# Patient Record
Sex: Male | Born: 1995 | Race: Black or African American | Hispanic: No | Marital: Single | Smoking: Never smoker
Health system: Southern US, Community
[De-identification: ages and names within clinical notes are randomized; demographics above are authoritative.]

## PROBLEM LIST (undated history)

## (undated) DIAGNOSIS — R21 Rash and other nonspecific skin eruption: Secondary | ICD-10-CM

## (undated) DIAGNOSIS — R1084 Generalized abdominal pain: Secondary | ICD-10-CM

## (undated) DIAGNOSIS — Z113 Encounter for screening for infections with a predominantly sexual mode of transmission: Secondary | ICD-10-CM

---
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
## 2017-05-29 ENCOUNTER — Encounter (HOSPITAL_COMMUNITY): Payer: Self-pay | Admitting: Emergency Medicine

## 2017-05-29 ENCOUNTER — Ambulatory Visit (HOSPITAL_COMMUNITY)
Admission: EM | Admit: 2017-05-29 | Discharge: 2017-05-29 | Disposition: A | Discharge status: Home / Self Care | Payer: Self-pay | Attending: Emergency Medicine | Admitting: Emergency Medicine

## 2017-05-29 DIAGNOSIS — T7840XA Allergy, unspecified, initial encounter: Secondary | ICD-10-CM

## 2017-05-29 MED ORDER — PREDNISONE 10 MG (21) PO TBPK: ORAL_TABLET | ORAL | 0 refills | Status: DC

## 2017-05-29 NOTE — ED Provider Notes (Signed)
  [facility]   [id number] 05/29/17 Arrival Time: 1344   SUBJECTIVE:  Aurelio Brashaekeem Hodapp is a 21 y.o. male who presents to the urgent care with complaint of swelling and itching on his penis. He is currently being treated for a fungal infection and has had improvement in symptoms until last night. He reports he had intercourse and wore a condom and shortly after that the skin became red and swollen. He is concerned about the possibility of latex allergy. He has no shortness of breath, wheezing, or difficulty swelling. Symptoms are limited to the penis. He has no dysuria, frequency, or urgency, no flank pain, or other symptoms.     History reviewed. No pertinent past medical history. Social History   Social History  . Marital status: Single    Spouse name: N/A  . Number of children: N/A  . Years of education: N/A   Occupational History  . Not on file.   Social History Main Topics  . Smoking status: Never Smoker  . Smokeless tobacco: Never Used  . Alcohol use Yes  . Drug use: No  . Sexual activity: Not on file   Other Topics Concern  . Not on file   Social History Narrative  . No narrative on file   No outpatient prescriptions have been marked as taking for the 05/29/17 encounter [facility](Hospital Encounter).   Allergies  Allergen Reactions  . Latex       ROS: As per HPI, remainder of ROS negative.   OBJECTIVE:  Vitals:   05/29/17 1528  BP: (!) 118/52  Pulse: 61  Resp: 20  Temp: 98.1 F (36.7 C)  TempSrc: Oral  SpO2: 100%       General Appearance:  awake, alert, oriented, in no acute distress, well developed, well nourished and in no acute distress Skin:  Area of erythema and crusting forming a ringlike pattern surrounding the penis. Head/face:  NCAT Ears:  External- bilateral-  normal Back:  no pain to palpation, no CVA tenderness Urogen: Circumcised male, no urethra discharge, no fascicular lesions, or-like lesions, no inguinal lymphadenopathy, no  pain with palpation of the testicles Psych exam:alert,oriented, in NAD with a full range of affect, normal behavior and no psychotic features     Labs:  No results found for this or any previous visit.  Labs Reviewed - No data to display  No results found.     ASSESSMENT & PLAN:  1. Allergic reaction, initial encounter     Meds ordered this encounter  Medications  . predniSONE (STERAPRED UNI-PAK 21 TAB) 10 MG (21) TBPK tablet    Sig: Take 6 tablets tomorrow, decrease by 1 each day till finished (4,0,9,8,1,1(6,5,4,3,2,1)    Dispense:  21 tablet    Refill:  0    Order Specific Question:   Supervising Provider    Answer:   Sharlene DoryWENDLING, NICHOLAS PAUL [[id number]]   Follow up with dermatology if symptoms persist  Reviewed expectations re: course of current medical issues. Questions answered. Outlined signs and symptoms indicating need for more acute intervention. Patient verbalized understanding. After Visit Summary given.    Procedures:        Dorena BodoKennard, Troy Kanouse, NP 05/29/17 1621

## 2017-05-29 NOTE — ED Triage Notes (Signed)
Pt here for possible allergic reaction to condom latex  Sts he had SI last night and penis was swollen, itching and painful  Denies SOB, dyspnea, dysphagia   A&O x4... NAD... Ambulatory

## 2017-05-29 NOTE — Discharge Instructions (Signed)
Continue the antifungal's as prescribed by your primary care doctor. To treat for an allergic reaction, and started you on a prednisone taper, take this as directed. I also take over-the-counter Benadryl 2 tablets every 6 hours, not to exceed 300 mg a day. Consider latex free products for future use, if symptoms continue, follow up with a dermatologist as needed.

## 2018-01-24 ENCOUNTER — Encounter (HOSPITAL_COMMUNITY): Payer: Self-pay

## 2018-01-24 ENCOUNTER — Emergency Department (HOSPITAL_COMMUNITY)
Admission: EM | Admit: 2018-01-24 | Discharge: 2018-01-24 | Disposition: A | Discharge status: Home / Self Care | Payer: 59 | Attending: Emergency Medicine | Admitting: Emergency Medicine

## 2018-01-24 DIAGNOSIS — Z91040 Latex allergy status: Secondary | ICD-10-CM | POA: Diagnosis not present

## 2018-01-24 DIAGNOSIS — N509 Disorder of male genital organs, unspecified: Secondary | ICD-10-CM | POA: Insufficient documentation

## 2018-01-24 DIAGNOSIS — Z113 Encounter for screening for infections with a predominantly sexual mode of transmission: Secondary | ICD-10-CM | POA: Insufficient documentation

## 2018-01-24 NOTE — ED Triage Notes (Signed)
Pt presents with 2 day h/o bumps on penis.  Pt denies any dysuria or penile drainage.

## 2018-01-24 NOTE — Discharge Instructions (Addendum)
Please read the instructions below.  Talk with your primary care provider about any new medications.  You will receive a call from the hospital if your test results come back positive. Avoid sexual activity until you know your test results. If your results come back positive, it is important that you inform all of your sexual partners.  Return to the ER for new or worsening symptoms.

## 2018-01-24 NOTE — ED Provider Notes (Signed)
[facility] EMERGENCY DEPARTMENT Provider Note   CSN: [id number] Arrival date & time: 01/24/18  2107     History   Chief Complaint No chief complaint on file.   HPI Tyler Holden is a 22 y.o. male with past medical history of chlamydia, presenting to the ED with concern of small bumps to his penis that he noticed 2 days ago.  Patient states the bumps are not painless or itchy.  Denies drainage from bumps.  States he is currently sexually active with male partners only, and uses protection every time.  Denies penile discharge, dysuria, testicular pain or swelling, abdominal pain, pain with defecation.  The history is provided by the patient.    History reviewed. No pertinent past medical history.  There are no active problems to display for this patient.   History reviewed. No pertinent surgical history.      Home Medications    Prior to Admission medications   Medication Sig Start Date End Date Taking? Authorizing Provider  predniSONE (STERAPRED UNI-PAK 21 TAB) 10 MG (21) TBPK tablet Take 6 tablets tomorrow, decrease by 1 each day till finished (6,5,4,3,2,1) 05/29/17   Dorena Bodo, NP    Family History History reviewed. No pertinent family history.  Social History Social History   Tobacco Use  . Smoking status: Never Smoker  . Smokeless tobacco: Never Used  Substance Use Topics  . Alcohol use: Yes  . Drug use: No     Allergies   Latex   Review of Systems Review of Systems  Constitutional: Negative for fever.  Gastrointestinal: Negative for abdominal pain.  Genitourinary: Negative for discharge, dysuria, penile pain, scrotal swelling and testicular pain.       "bumps" on penis     Physical Exam Updated Vital Signs BP 112/68 (BP Location: Right Arm)   Pulse 93   Temp 99.6 F (37.6 C) (Oral)   Resp 18   Ht  (1.854 m)   Wt 72.6 kg (160 lb)   SpO2 100%   BMI 21.11 kg/m   Physical Exam  Constitutional: He appears  well-developed and well-nourished. No distress.  HENT:  Head: Normocephalic and atraumatic.  Eyes: Conjunctivae are normal.  Pulmonary/Chest: Effort normal.  Genitourinary: Testes normal. Right testis shows no mass, no swelling and no tenderness. Left testis shows no mass, no swelling and no tenderness. Circumcised. No penile tenderness. No discharge found.  Genitourinary Comments: Exam performed with EMT chaperone present. Small pinpoint flesh-colored papules to penile shaft. No vesicles, no ulcerations, nontender.   Psychiatric: He has a normal mood and affect. His behavior is normal.  Nursing note and vitals reviewed.     ED Treatments / Results  Labs (all labs ordered are listed, but only abnormal results are displayed) Labs Reviewed  HIV ANTIBODY (ROUTINE TESTING)  RPR  GC/CHLAMYDIA PROBE AMP ([place]) NOT AT [facility]    EKG None  Radiology No results found.  Procedures Procedures (including critical care time)  Medications Ordered in ED Medications - No data to display   Initial Impression / Assessment and Plan / ED Course  I have reviewed the triage vital signs and the nursing notes.  Pertinent labs & imaging results that were available during my care of the patient were reviewed by me and considered in my medical decision making (see chart for details).     Patient w concern for STD with small asymptomatic papules to penile shaft; exam consistent with pearly penile papules. No penile discharge. Pt  is afebrile without abdominal tenderness, abdominal pain or painful bowel movements to indicate prostatitis.  No tenderness to palpation of the testes or epididymis to suggest orchitis or epididymitis.  STD cultures obtained including HIV, syphilis, gonorrhea and chlamydia. Patient to be discharged with instructions to follow up with PCP. Discussed prophylactic tx for GC/Ch, and pt declined and would like to wait for STD results; agree with plan as no indication for STD w  presentation or exam. Discussed importance of using protection when sexually active. Pt understands that they have GC/Chlamydia cultures pending and that they will need to inform all sexual partners if results return positive.  Discussed results, findings, treatment and follow up. Patient advised of return precautions. Patient verbalized understanding and agreed with plan.  Final Clinical Impressions(s) / ED Diagnoses   Final diagnoses:  Screening examination for STD (sexually transmitted disease)    ED Discharge Orders    None       Thoams Siefert, [place] N, PA-C 01/24/18 2340    Mesner, Barbara Cower, MD 01/26/18 [phone]

## 2018-01-25 LAB — RPR: RPR Ser Ql: NONREACTIVE

## 2018-01-25 LAB — GC/CHLAMYDIA PROBE AMP (~~LOC~~) NOT AT ARMC
CHLAMYDIA, DNA PROBE: NEGATIVE
NEISSERIA GONORRHEA: NEGATIVE

## 2018-01-25 LAB — HIV ANTIBODY (ROUTINE TESTING W REFLEX): HIV Screen 4th Generation wRfx: NONREACTIVE

## 2018-09-14 DIAGNOSIS — R002 Palpitations: Secondary | ICD-10-CM | POA: Insufficient documentation

## 2018-09-14 DIAGNOSIS — F419 Anxiety disorder, unspecified: Secondary | ICD-10-CM | POA: Insufficient documentation

## 2019-04-10 ENCOUNTER — Other Ambulatory Visit: Payer: Self-pay | Admitting: *Deleted

## 2019-04-10 DIAGNOSIS — Z20822 Contact with and (suspected) exposure to covid-19: Secondary | ICD-10-CM

## 2019-04-15 LAB — NOVEL CORONAVIRUS, NAA: SARS-CoV-2, NAA: NOT DETECTED

## 2019-06-29 ENCOUNTER — Other Ambulatory Visit: Payer: Self-pay

## 2019-06-29 DIAGNOSIS — Z20822 Contact with and (suspected) exposure to covid-19: Secondary | ICD-10-CM

## 2019-06-30 LAB — NOVEL CORONAVIRUS, NAA: SARS-CoV-2, NAA: NOT DETECTED

## 2019-08-08 ENCOUNTER — Emergency Department (HOSPITAL_COMMUNITY): Payer: PRIVATE HEALTH INSURANCE

## 2019-08-08 ENCOUNTER — Other Ambulatory Visit: Payer: Self-pay

## 2019-08-08 ENCOUNTER — Encounter (HOSPITAL_COMMUNITY): Payer: Self-pay

## 2019-08-08 ENCOUNTER — Emergency Department (HOSPITAL_COMMUNITY)
Admission: EM | Admit: 2019-08-08 | Discharge: 2019-08-08 | Disposition: A | Discharge status: Home / Self Care | Payer: PRIVATE HEALTH INSURANCE | Attending: Emergency Medicine | Admitting: Emergency Medicine

## 2019-08-08 DIAGNOSIS — Y9389 Activity, other specified: Secondary | ICD-10-CM | POA: Insufficient documentation

## 2019-08-08 DIAGNOSIS — Y990 Civilian activity done for income or pay: Secondary | ICD-10-CM | POA: Diagnosis not present

## 2019-08-08 DIAGNOSIS — W230XXA Caught, crushed, jammed, or pinched between moving objects, initial encounter: Secondary | ICD-10-CM | POA: Insufficient documentation

## 2019-08-08 DIAGNOSIS — Y9289 Other specified places as the place of occurrence of the external cause: Secondary | ICD-10-CM | POA: Insufficient documentation

## 2019-08-08 DIAGNOSIS — S60132A Contusion of left middle finger with damage to nail, initial encounter: Secondary | ICD-10-CM | POA: Diagnosis not present

## 2019-08-08 DIAGNOSIS — S6992XA Unspecified injury of left wrist, hand and finger(s), initial encounter: Secondary | ICD-10-CM | POA: Diagnosis present

## 2019-08-08 NOTE — ED Provider Notes (Signed)
[facility] DEPT Provider Note   CSN: [id number] Arrival date & time: 08/08/19  2033     History   Chief Complaint Chief Complaint  Patient presents with  . Finger Injury    HPI Tyler Holden is a 23 y.o. male.     Pt crushed his finger in a crate at work   The history is provided by the patient. No language interpreter was used.  Hand Pain This is a new problem. The current episode started 3 to 5 hours ago. The problem occurs constantly. The problem has not changed since onset.Nothing aggravates the symptoms. Nothing relieves the symptoms. He has tried nothing for the symptoms.  Pt crushed finger in a crate at work   History reviewed. No pertinent past medical history.  There are no active problems to display for this patient.   History reviewed. No pertinent surgical history.      Home Medications    Prior to Admission medications   Medication Sig Start Date End Date Taking? Authorizing Provider  predniSONE (STERAPRED UNI-PAK 21 TAB) 10 MG (21) TBPK tablet Take 6 tablets tomorrow, decrease by 1 each day till finished (6,5,4,3,2,1) 05/29/17   Barnet Glasgow, NP    Family History No family history on file.  Social History Social History   Tobacco Use  . Smoking status: Never Smoker  . Smokeless tobacco: Never Used  Substance Use Topics  . Alcohol use: Yes  . Drug use: No     Allergies   Latex   Review of Systems Review of Systems  All other systems reviewed and are negative.    Physical Exam Updated Vital Signs BP 131/66 (BP Location: Left Arm)   Pulse 64   Temp 98.8 F (37.1 C) (Oral)   Resp 16   Ht 6' (1.829 m)   Wt 72.6 kg   SpO2 100%   BMI 21.70 kg/m   Physical Exam Vitals signs reviewed.  Musculoskeletal:        General: Tenderness and signs of injury present.     Comments: Tender left index finger, bruised, sbungual hematoma, 20mm small laceration no gapping nv and ns intact  Skin:  General: Skin is warm.  Neurological:     General: No focal deficit present.     Mental Status: He is alert.  Psychiatric:        Mood and Affect: Mood normal.      ED Treatments / Results  Labs (all labs ordered are listed, but only abnormal results are displayed) Labs Reviewed - No data to display  EKG None  Radiology Dg Finger Middle Left  Result Date: 08/08/2019 CLINICAL DATA:  Pain EXAM: LEFT MIDDLE FINGER 2+V COMPARISON:  None. FINDINGS: There is no evidence of fracture or dislocation. There is no evidence of arthropathy or other focal bone abnormality. Soft tissues are unremarkable. IMPRESSION: Negative. Electronically Signed   By: Constance Holster M.D.   On: 08/08/2019 21:16    Procedures Procedures (including critical care time)  Medications Ordered in ED Medications - No data to display   Initial Impression / Assessment and Plan / ED Course  I have reviewed the triage vital signs and the nursing notes.  Pertinent labs & imaging results that were available during my care of the patient were reviewed by me and considered in my medical decision making (see chart for details).        MDM  Xray reviewed and discussed with pt.  Pt placed ina splint  Final Clinical Impressions(s) / ED Diagnoses   Final diagnoses:  Contusion of left middle finger with damage to nail, initial encounter    ED Discharge Orders    None    An After Visit Summary was printed and given to the patient.    Elson Areas, Cordelia Poche 08/08/19 2220    Mancel Bale, MD 08/08/19 2350

## 2019-08-08 NOTE — ED Triage Notes (Signed)
Pt coming from home c/o finger injury that happened at work tonight. Pt slammed finger in a metal cage and has a laceration on middle finger of left hand. Swollen but bleeding controlled.

## 2019-12-13 ENCOUNTER — Ambulatory Visit (INDEPENDENT_AMBULATORY_CARE_PROVIDER_SITE_OTHER): Payer: 59

## 2019-12-13 ENCOUNTER — Ambulatory Visit: Payer: Self-pay

## 2019-12-13 ENCOUNTER — Ambulatory Visit: Payer: 59 | Admitting: Orthopedic Surgery

## 2019-12-13 ENCOUNTER — Other Ambulatory Visit: Payer: Self-pay

## 2019-12-13 ENCOUNTER — Ambulatory Visit (INDEPENDENT_AMBULATORY_CARE_PROVIDER_SITE_OTHER): Payer: 59 | Admitting: Orthopedic Surgery

## 2019-12-13 ENCOUNTER — Encounter: Payer: Self-pay | Admitting: Orthopedic Surgery

## 2019-12-13 VITALS — Ht 72.0 in | Wt 160.0 lb

## 2019-12-13 DIAGNOSIS — M2142 Flat foot [pes planus] (acquired), left foot: Secondary | ICD-10-CM | POA: Diagnosis not present

## 2019-12-13 DIAGNOSIS — M2141 Flat foot [pes planus] (acquired), right foot: Secondary | ICD-10-CM

## 2019-12-13 DIAGNOSIS — G8929 Other chronic pain: Secondary | ICD-10-CM

## 2019-12-13 DIAGNOSIS — M25571 Pain in right ankle and joints of right foot: Secondary | ICD-10-CM | POA: Diagnosis not present

## 2019-12-13 DIAGNOSIS — M25572 Pain in left ankle and joints of left foot: Secondary | ICD-10-CM | POA: Diagnosis not present

## 2019-12-13 NOTE — Progress Notes (Signed)
   Office Visit Note   Patient: Tyler Holden           Date of Birth: 13-Apr-1996           MRN: [id number] Visit Date: 12/13/2019              Requested by: No referring provider defined for this encounter. PCP: System, Provider Not In  Chief Complaint  Patient presents with  . Right Ankle - Pain  . Left Ankle - Pain      HPI: Patient is a 24 year old gentleman who states that as a child he is always had flat feet he has tried hard orthotics without relief.  He wants to know if there is something that he can do to improve his arch.  Assessment & Plan: Visit Diagnoses:  1. Chronic pain of both ankles   2. Pes planus of both feet     Plan: Recommended a cork orthotic such as sole orthotics.  Recommended plantar fascial strengthening for fascial integration.  Exercises were demonstrated.  Follow-up as needed.  Follow-Up Instructions: Return if symptoms worsen or fail to improve.   Ortho Exam  Patient is alert, oriented, no adenopathy, well-dressed, normal affect, normal respiratory effort. Examination patient has good pulses on both ankles.  He has good subtalar and ankle range of motion.  He does have pes planus with valgus of the hindfoot pronation valgus of the forefoot.  Patient can do a single limb heel raise his arch reconstitutes with a single limb heel raise.  His posterior tibial tendon is nontender to palpation bilaterally.  Imaging: XR Ankle 2 Views Left  Result Date: 12/13/2019 2 view radiographs of the left ankle shows a congruent tibiotalar joint there is pes planus with a valgus hindfoot the ankle is congruent no widening of the syndesmosis  XR Ankle 2 Views Right  Result Date: 12/13/2019 2 view radiographs of the right ankle shows pes planus with a congruent tibiotalar joint no widening of the syndesmosis valgus hindfoot  No images are attached to the encounter.  Labs: No results found for: HGBA1C, ESRSEDRATE, CRP, LABURIC, REPTSTATUS, GRAMSTAIN, CULT,  LABORGA   No results found for: ALBUMIN, PREALBUMIN, LABURIC  No results found for: MG No results found for: VD25OH  No results found for: PREALBUMIN No flowsheet data found.   Body mass index is 21.7 kg/m.  Orders:  Orders Placed This Encounter  Procedures  . XR Ankle 2 Views Left  . XR Ankle 2 Views Right   No orders of the defined types were placed in this encounter.    Procedures: No procedures performed  Clinical Data: No additional findings.  ROS:  All other systems negative, except as noted in the HPI. Review of Systems  Objective: Vital Signs: Ht 6' (1.829 m)   Wt 160 lb (72.6 kg)   BMI 21.70 kg/m   Specialty Comments:  No specialty comments available.  PMFS History: There are no problems to display for this patient.  History reviewed. No pertinent past medical history.  History reviewed. No pertinent family history.  History reviewed. No pertinent surgical history. Social History   Occupational History  . Not on file  Tobacco Use  . Smoking status: Never Smoker  . Smokeless tobacco: Never Used  Substance and Sexual Activity  . Alcohol use: Yes  . Drug use: No  . Sexual activity: Not on file

## 2020-06-11 ENCOUNTER — Other Ambulatory Visit: Payer: 59

## 2020-06-11 ENCOUNTER — Other Ambulatory Visit: Payer: Self-pay

## 2020-06-11 DIAGNOSIS — Z20822 Contact with and (suspected) exposure to covid-19: Secondary | ICD-10-CM

## 2020-06-14 LAB — NOVEL CORONAVIRUS, NAA: SARS-CoV-2, NAA: NOT DETECTED

## 2021-04-20 IMAGING — CR DG FINGER MIDDLE 2+V*L*
3 series · 3 of 3 positions shown · non-contrast
Comparison: None.

CLINICAL DATA: Pain

EXAM:
LEFT MIDDLE FINGER 2+V

[x finger pa left]
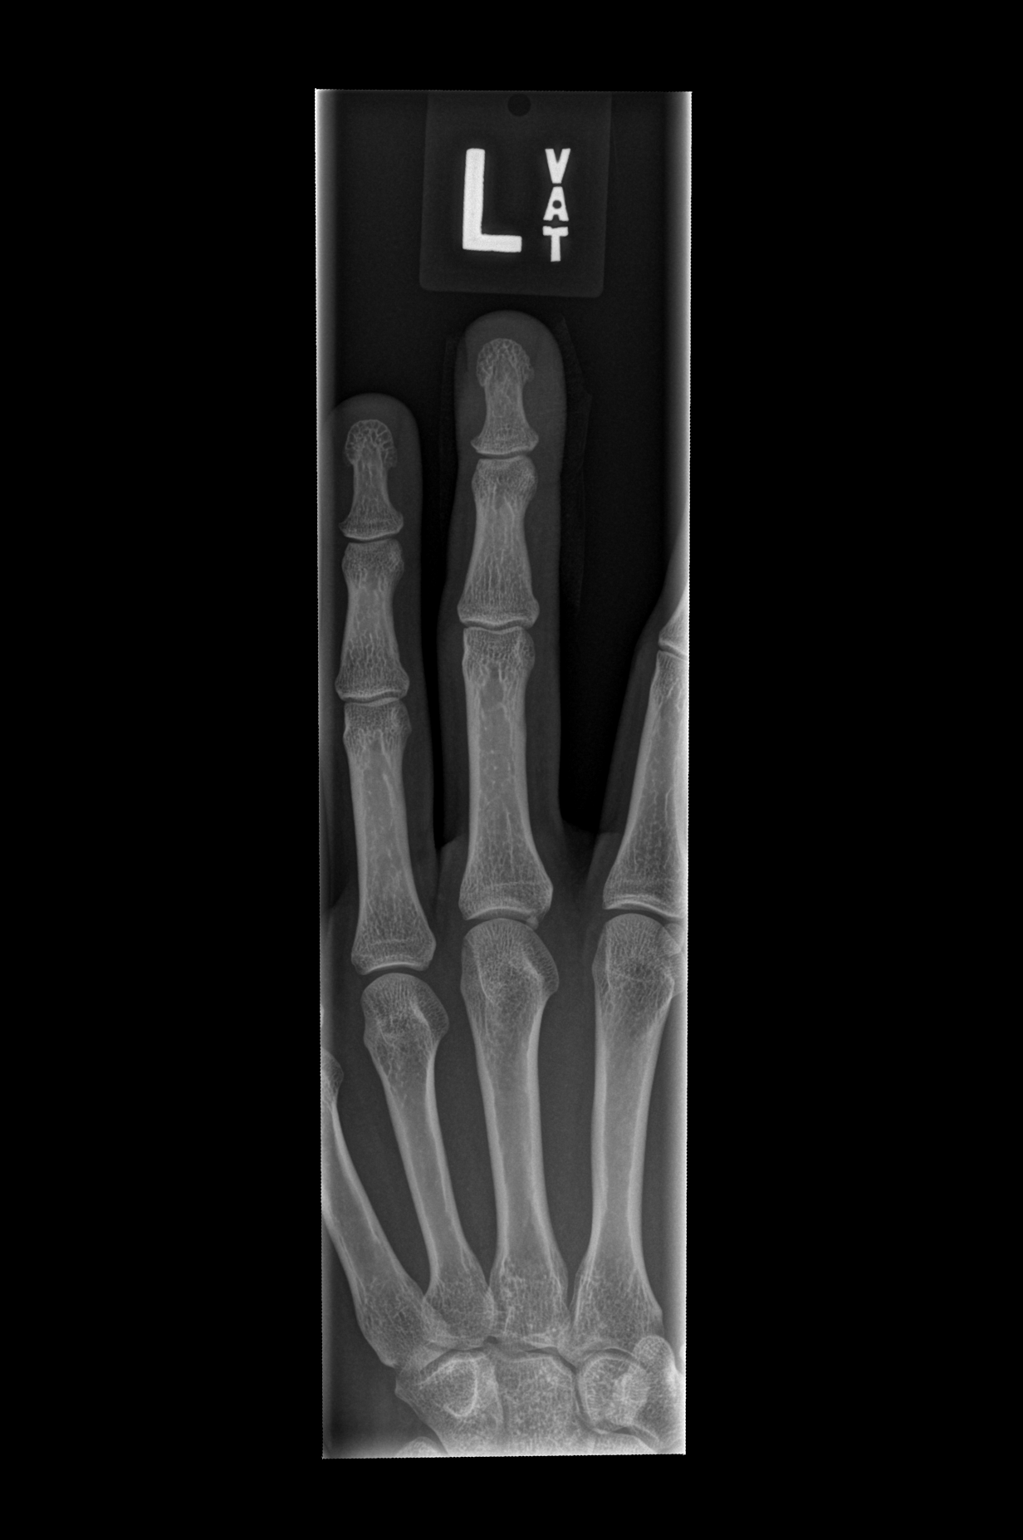

[x finger obl left]
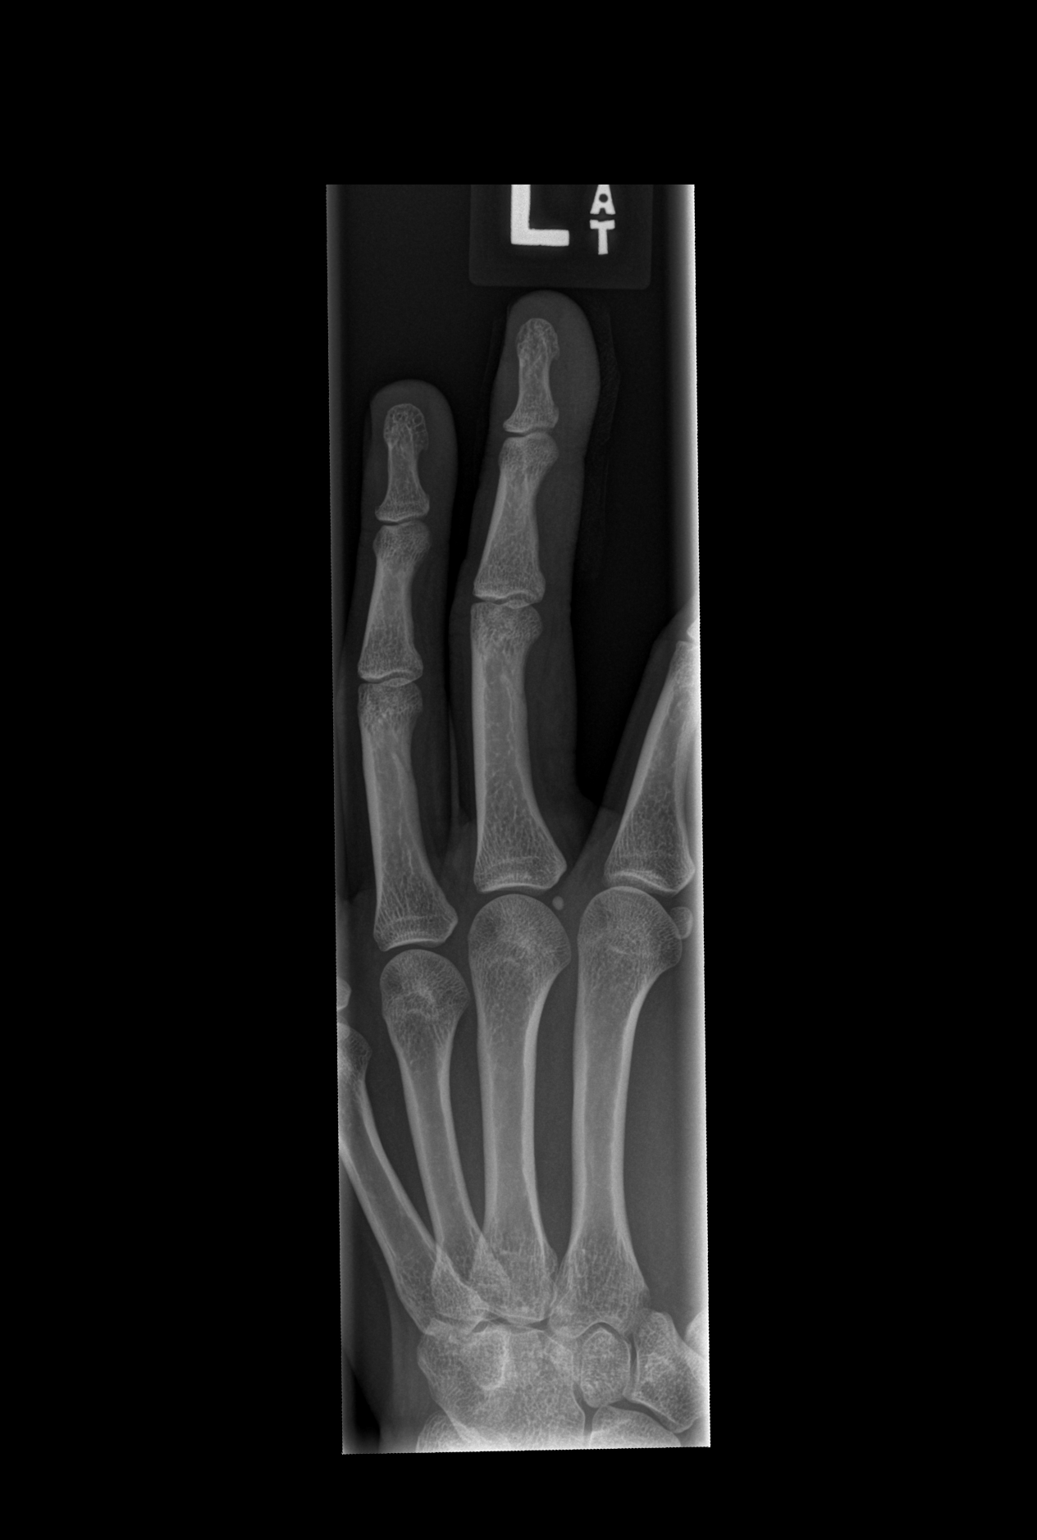

[x finger lat left]
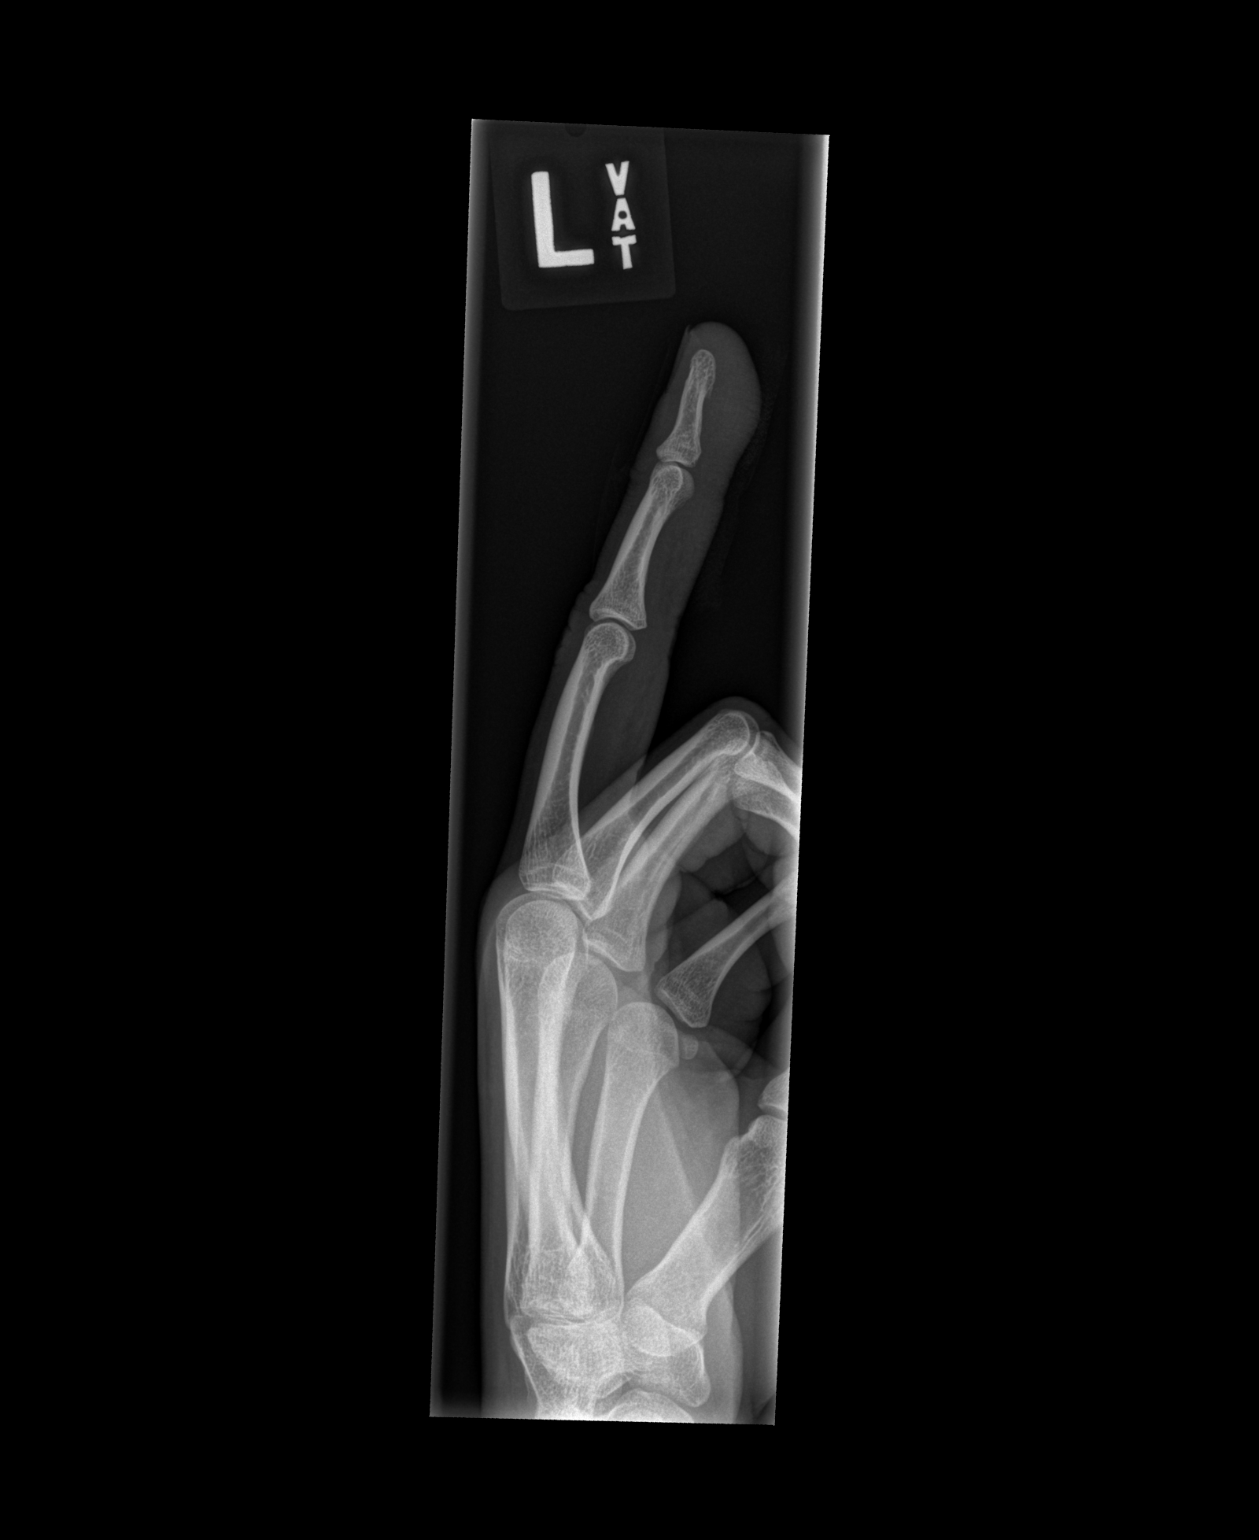

[3 of 3 positions shown; findings below may reference images not displayed]

FINDINGS: There is no evidence of fracture or dislocation. There is no
evidence of arthropathy or other focal bone abnormality. Soft
tissues are unremarkable.
IMPRESSION: Negative.

## 2021-10-30 ENCOUNTER — Ambulatory Visit
Admit: 2021-10-30 | Payer: BLUE CROSS/BLUE SHIELD | Attending: Student in an Organized Health Care Education/Training Program | Primary: Student in an Organized Health Care Education/Training Program

## 2021-10-30 ENCOUNTER — Encounter

## 2021-10-30 DIAGNOSIS — Z0000 Encounter for general adult medical examination without abnormal findings: Secondary | ICD-10-CM

## 2021-10-30 LAB — TSH WITH REFLEX: TSH: 0.88 u[IU]/mL (ref 0.27–4.20)

## 2021-10-30 NOTE — Progress Notes (Signed)
[facility]  Establish care visit   10/30/2021    Levi Hurst (DOB:  03/13/1996) is a 26 y.o. male, here to establish care.    Chief Complaint   Patient presents with    New Patient        ASSESSMENT/ PLAN  1. Well adult exam  General wellness exam. Reviewed chart for past hx and updated today. Counseled on age appropriate health guidance and discussed screening recommendations. Vaccinations reviewed and discussed. All questions answered      2. 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  Unclear what previous treatments patient was given for the above concerns.  Given that patient is concerned more about hormones today, will evaluate for hormonal problems.  Advised patient to follow dermatology instructions, as they are the experts on skin concerns.  - Testosterone, free, total; Future  - TSH with Reflex; Future    3. Hair thinning  Patient with reported hair thinning, and difficulty with morning erections.  We will also evaluate utilizing hormone testing below.  - Testosterone, free, total; Future  - TSH with Reflex; Future     Overall, do not overly anticipate patient having problems with hormones, but it is possible.  We will obtain levels and contact patient with results.  Patient will continue to follow dermatology instructions for skin rash.      Return in about 1 year (around 10/30/2022).    HPI  Patient is a 26 year old male with no major past medical history, who presents to establish care and to discuss a few recent issues.    Overall, patient tries to stay generally healthy.  He is currently attempting to bulk up muscle mass and eating lean proteins.  For exercise he does not do very much cardio because he was trying to ball, but he is weight lifting up to 5 days/week.  He is up-to-date on vaccinations other than the flu vaccine.  He has got the flu vaccine in the past, does not this years version.  He does not use any tobacco products.  He intermittently drinks alcohol.   He does not use any drugs.  He is sexually active, but declines STD testing at this time.  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  He lives in [place].  He has not yet gotten a dentist in the area.  He is considering just visiting the dentist in [place] when he visits home.    Some concerns that the patient would like to bring up today include skin breakouts on the face, shoulders, and back primarily.  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  He has tried a cream from the dermatologist and has had a couple of visits with them.  He cannot remember what cream he was given, but reports he was also prescribed AmLactin lotion.  Patient states that his breakouts have not gotten any better, will intermittently pop and he has some scars on his shoulders.  Patient also reports that he thinks that his nails are more brittle and that he is having thinning hair as well.  Patient is wondering if it is related to hormones.    Patient also states that he is not noticing any morning time erections like previously.  He states however that he is able to obtain and maintain erections throughout intercourse.  He has not noticed any changes to facial hair.  He has not noticed any changes to libido.  He denies palpitations, bradycardia, constipation, diarrhea, dry skin, or diaphoresis.     ROS  Review of Systems   All other systems reviewed and are negative.     HISTORIES  No current outpatient medications on file prior to visit.     No current facility-administered medications on file prior to visit.        No Known Allergies    No past medical history on file.    There is no problem list on file for this patient.      No past surgical history on file.    Social History     Socioeconomic  History    Marital status: Unknown     Spouse name: Not on file    Number of children: Not on file    Years of education: Not on file    Highest education level: Not on file   Occupational History    Not on file   Tobacco Use    Smoking status: Never    Smokeless tobacco: Never   Vaping Use    Vaping Use: Some days   Substance and Sexual Activity    Alcohol use: Yes    Drug use: Never    Sexual activity: Not on file   Other Topics Concern    Not on file   Social History Narrative    Not on file     Social Determinants of Health     Financial Resource Strain: Not on file   Food Insecurity: Not on file   Transportation Needs: Not on file   Physical Activity: Not on file   Stress: Not on file   Social Connections: Not on file   Intimate Partner Violence: Not on file   Housing Stability: Not on file        No family history on file.    PE  Vitals:    10/30/21 1047   BP: 118/68   Pulse: 73   SpO2: 98%   Weight: 189 lb (85.7 kg)   Height: 6' (1.829 m)     Estimated body mass index is 25.63 kg/m?? as calculated from the following:    Height as of this encounter: 6' (1.829 m).    Weight as of this encounter: 189 lb (85.7 kg).    Physical Exam  Vitals reviewed.   Constitutional:       General: He is not in acute distress.     Appearance: Normal appearance. He is not ill-appearing, toxic-appearing or diaphoretic.   HENT:      Head: Normocephalic and atraumatic.      Right Ear: External ear normal.      Left Ear: External ear normal.      Mouth/Throat:      Mouth: Mucous membranes are moist.   Eyes:      General: No scleral icterus.     Conjunctiva/sclera: Conjunctivae normal.   Cardiovascular:      Rate and Rhythm: Normal rate and regular rhythm.      Heart sounds: Normal heart sounds. No murmur heard.  Pulmonary:      Effort: Pulmonary effort is normal. No respiratory distress.      Breath sounds: Normal breath sounds. No wheezing.   Abdominal:      General: There is no distension.  Musculoskeletal:      Cervical back:  Normal range of motion.   Skin:     General: Skin is warm and dry.      Comments: Small bumps noted across shoulders and back, even some of the forearms.  They are 1 to 2 mm in diameter, slightly raised, slightly reddened.  Appearance of some scars around the area.   Neurological:      Mental Status: He is alert. Mental status is at baseline.   Psychiatric:         Behavior: Behavior normal.         There is no immunization history on file for this patient.    Health Maintenance   Topic Date Due    COVID-19 Vaccine (1) Never done    Varicella vaccine (1 of 2 - 2-dose childhood series) Never done    HPV vaccine (1 - Male 2-dose series) Never done    Depression Screen  Never done    HIV screen  Never done    Hepatitis C screen  Never done    DTaP/Tdap/Td vaccine (1 - Tdap) Never done    Flu vaccine (1) Never done    Hepatitis A vaccine  Aged Out    Hib vaccine  Aged Out    Meningococcal (ACWY) vaccine  Aged Out    Pneumococcal 0-64 years Vaccine  Aged Out       PSH, PMH, SH and FH reviewed and noted.  Recent and past labs, tests and consults also reviewed.  Recent or new meds also reviewed.    Doree Barthel, MD    This dictation was generated by voice recognition computer software.  Although all attempts are made to edit the dictation for accuracy, there may be errors in the transcription that are not intended.

## 2021-11-03 LAB — TESTOSTERONE, FREE, TOTAL
Sex Hormone Binding: 40 nmol/L (ref 11–80)
Testosterone, Free: 90.4 pg/mL (ref 47–244)
Testosterone: 487 ng/dL (ref 220–1000)

## 2021-11-09 NOTE — Telephone Encounter (Addendum)
Patient saw Dr. Avel Sensor on 10/30/21 for a skin rash and hair thinning. Patient stated tests came back normal, but he is still experiencing these symptoms. Patient is asking if Dr. Avel Sensor can send a referral to a dermatologist as he states he won't be able to get an appointment for months.     Please advise.

## 2021-11-10 ENCOUNTER — Encounter

## 2021-11-10 NOTE — Telephone Encounter (Signed)
Referral placed. If patient needs external referral, let me know.

## 2021-11-10 NOTE — Telephone Encounter (Signed)
Called pt gave info provided for referral

## 2021-11-10 NOTE — Telephone Encounter (Signed)
Patient called and stated Dermatologist did not have any availability soon, couple months out. He was emailed per his request a list of a few more providers. He will call and check availability and call back if new referral is needed.

## 2021-11-10 NOTE — Telephone Encounter (Signed)
Left message for Pt call the office back about referral. Referral printed and is up front

## 2022-01-12 NOTE — Telephone Encounter (Signed)
Pt scheduled

## 2022-01-12 NOTE — Telephone Encounter (Signed)
Patient contacted the office he tested positive for covid and has been taking Dayquil, nightquil and Ibuprofen. His concern is that he is having stomach cramps, top of stomach, no vomiting or nausea. Feels like he needs to go but then nothing for a few hours after. Please advise [phone]

## 2022-01-12 NOTE — Telephone Encounter (Signed)
Needs an office visit

## 2022-01-13 ENCOUNTER — Telehealth
Admit: 2022-01-13 | Discharge: 2022-01-13 | Payer: BLUE CROSS/BLUE SHIELD | Attending: Student in an Organized Health Care Education/Training Program | Primary: Student in an Organized Health Care Education/Training Program

## 2022-01-13 ENCOUNTER — Encounter
Payer: BLUE CROSS/BLUE SHIELD | Attending: Student in an Organized Health Care Education/Training Program | Primary: Student in an Organized Health Care Education/Training Program

## 2022-01-13 DIAGNOSIS — U071 COVID-19: Secondary | ICD-10-CM

## 2022-01-13 NOTE — Progress Notes (Signed)
Levi Hurst (DOB:  1996-05-01) is a Established patient, presenting virtually for evaluation of the following:    Assessment & Plan   Below is the assessment and plan developed based on review of pertinent history, physical exam, labs, studies, and medications.  1. COVID-19  2. Constipation, unspecified constipation type  Instructed patient on most recent isolation and masking recommendations about those diagnosed with COVID-19.  Advised patient that he may start over-the-counter senna in addition to his MiraLAX until he has a bowel movement.  Consistency of pudding is goal.  Patient f/u as needed for the above concern.    No follow-ups on file.       Subjective   HPI  Patient is a 26 year old male, was recent diagnosed with COVID-19 via an at home testing kit.  Patient said that his symptoms started on Friday with a cough, sore throat, nasal congestion, sinus congestion, fever to 100.5 degrees.  No chest pain or shortness of breath, no changes in sensation of taste or smell.  Patient is having some abdominal discomfort, and was not having vomiting.  In reviewing the constipation, patient started p.o. yesterday and today, had 1 medium sized bowel movements, no BM today.  Patient does not feel like his abdomen is distended.  Patient is wondering if there is anything else we can do to help his bowels.    Review of Systems   All other systems reviewed and are negative.       Objective   Patient-Reported Vitals: None  No data recorded     Physical Exam  Cardiovascular:      Comments: Patient appears well perfused  Pulmonary:      Comments: Patient not dyspneic to conversation  Neurological:      Mental Status: He is alert.       On this date 01/13/2022 I have spent 20 minutes reviewing previous notes, test results and face to face (virtual) with the patient discussing the diagnosis and importance of compliance with the treatment plan as well as documenting on the day of the visit.    Levi Hurst, was evaluated  through a synchronous (real-time) audio-video encounter. The patient (or guardian if applicable) is aware that this is a billable service, which includes applicable co-pays. This Virtual Visit was conducted with patient's (and/or legal guardian's) consent. The visit was conducted pursuant to the emergency declaration under the [place], [place] waiver authority and the [employer] Act.  Patient identification was verified, and a caregiver was present when appropriate.   The patient was located at [place] across [employer] was located at [employer] ([place] Dept): [place] [address]         --Mannie Stabile, MD

## 2022-01-18 ENCOUNTER — Encounter
Payer: BLUE CROSS/BLUE SHIELD | Attending: Family | Primary: Student in an Organized Health Care Education/Training Program

## 2022-01-18 NOTE — Telephone Encounter (Signed)
Patient is still experiencing pain in his abdomin and back after he eats. He did take the medication that Dr.Stump prescribed but he was becoming dehydrated from using the restroom so much ,so he discontinued the medication.  Please advise

## 2022-01-18 NOTE — Telephone Encounter (Addendum)
Patient phoned back and said he would like to wait to see what Dr.Stump says. He said he did have a VV last week. I explained they may need to check his stomach in person. He was Covid positive on 02/12/22.

## 2022-01-18 NOTE — Telephone Encounter (Signed)
Left message for pt to inform him Dr Treasa School is out of office today, but an appointment would probably be needed and for pt to call back to schedule either Monday with NP or Tuesday with Dr Treasa School.

## 2022-01-19 ENCOUNTER — Encounter

## 2022-01-19 ENCOUNTER — Encounter
Admit: 2022-01-19 | Payer: BLUE CROSS/BLUE SHIELD | Attending: Student in an Organized Health Care Education/Training Program | Primary: Student in an Organized Health Care Education/Training Program

## 2022-01-19 DIAGNOSIS — R1013 Epigastric pain: Secondary | ICD-10-CM

## 2022-01-19 NOTE — Telephone Encounter (Signed)
Pt notified

## 2022-01-19 NOTE — Telephone Encounter (Signed)
Ordered some blood work that the patient can complete.  Any further evaluation should be in person.

## 2022-01-19 NOTE — Progress Notes (Signed)
Levi Hurst (DOB:  [birth year]) is a 26 y.o. male,Established patient, here for evaluation of the following chief complaint(s):  Abdominal Pain (X1 week)         ASSESSMENT/PLAN:  1. Epigastric pain  -     CBC; Future  -     Comprehensive Metabolic Panel; Future  -     Lipid, Fasting; Future  -     H. Pylori Breath Test, Adult; Future  Unclear etiology at this time.  Given epigastric tenderness, worsening when lying down and after meals, may be related to either H. pylori infection versus ulcer versus GERD.  We will do H. pylori breath test and lab work as above.  Will trial PPI over-the-counter after these lab works are completed.  Patient will follow-up as needed for the above concerns.    2. Routine screening for STI (sexually transmitted infection)  -     Hepatitis C Antibody; Future  -     HIV Screen; Future  -     Syphilis Antibody Cascading Reflex; Future  -     C.trachomatis N.gonorrhoeae DNA, Urine ([facility] Only); Future      No follow-ups on file.         Subjective   SUBJECTIVE/OBJECTIVE:  HPI  Patient is a 26 year old male, recently diagnosed with COVID-19, on day 9 or 10 of symptoms.  Patient has been at work all week, has not had any nausea, vomiting, fever.  Patient's bowel movements have been back to normal.  However, patient reports that he is still having abdominal pain radiating to his back that is intermittent, but when it occurs it is painful.  Patient does not believe that it is constipation, because he has been taking the MiraLAX, had several bowel movements and did not improve.  Patient also endorses epigastric burning, worse when lying down, worse after meals.  Sometimes it radiates to the side.  Patient is wondering if it could be related to gallbladder.  Patient does not take any acid suppressing medications.  Patient also would like routine STI screening at this time.    Review of Systems   All other systems reviewed and are negative.       Objective     Vitals:    01/19/22 1438    BP: 138/68   Pulse: 97   SpO2: 98%   Weight: 186 lb 6.4 oz (84.6 kg)   Height: 6' (1.829 m)         Physical Exam  Vitals reviewed.   Constitutional:       General: He is not in acute distress.     Appearance: Normal appearance. He is not ill-appearing, toxic-appearing or diaphoretic.   HENT:      Head: Normocephalic and atraumatic.      Right Ear: External ear normal.      Left Ear: External ear normal.      Mouth/Throat:      Mouth: Mucous membranes are moist.   Eyes:      General: No scleral icterus.     Conjunctiva/sclera: Conjunctivae normal.   Cardiovascular:      Rate and Rhythm: Normal rate.   Pulmonary:      Effort: Pulmonary effort is normal.   Abdominal:      General: There is no distension.      Tenderness: There is no guarding or rebound.      Comments: Mildly tender to palpation in the epigastric region.  Otherwise no tenderness.  No overlying skin  changes.  Bowel sounds are normal.  Flat abdomen.   Musculoskeletal:      Right lower leg: No edema.      Left lower leg: No edema.   Skin:     General: Skin is warm and dry.   Neurological:      Mental Status: He is alert. Mental status is at baseline.   Psychiatric:         Behavior: Behavior normal.            On this date 01/19/2022 I have spent 20 minutes reviewing previous notes, test results and face to face with the patient discussing the diagnosis and importance of compliance with the treatment plan as well as documenting on the day of the visit.      An electronic signature was used to authenticate this note.    --Doree Barthel, MD

## 2022-01-20 LAB — HIV SCREEN
HIV ANTIGEN: NONREACTIVE
HIV Ag/Ab: NONREACTIVE
HIV-1 Antibody: NONREACTIVE
HIV-2 Ab: NONREACTIVE

## 2022-01-20 LAB — LIPID, FASTING
Cholesterol, Fasting: 166 mg/dL (ref 0–199)
HDL: 58 mg/dL (ref 40–60)
LDL Calculated: 99 mg/dL (ref <100)
Triglyceride, Fasting: 43 mg/dL (ref 0–150)
VLDL Cholesterol Calculated: 9 mg/dL

## 2022-01-20 LAB — CBC
Hematocrit: 43.3 % (ref 40.5–52.5)
Hemoglobin: 14.4 g/dL (ref 13.5–17.5)
MCH: 31.2 pg (ref 26.0–34.0)
MCHC: 33.3 g/dL (ref 31.0–36.0)
MCV: 93.7 fL (ref 80.0–100.0)
MPV: 8.1 fL (ref 5.0–10.5)
Platelets: 331 10*3/uL (ref 135–450)
RBC: 4.62 M/uL (ref 4.20–5.90)
RDW: 12.5 % (ref 12.4–15.4)
WBC: 9.5 10*3/uL (ref 4.0–11.0)

## 2022-01-20 LAB — HEPATITIS C ANTIBODY: Hep C Ab Interp: NONREACTIVE

## 2022-01-20 LAB — COMPREHENSIVE METABOLIC PANEL
ALT: 13 U/L (ref 10–40)
AST: 18 U/L (ref 15–37)
Albumin/Globulin Ratio: 1.7 (ref 1.1–2.2)
Albumin: 4.7 g/dL (ref 3.4–5.0)
Alkaline Phosphatase: 68 U/L (ref 40–129)
Anion Gap: 11 (ref 3–16)
BUN: 11 mg/dL (ref 7–20)
CO2: 29 mmol/L (ref 21–32)
Calcium: 10.3 mg/dL (ref 8.3–10.6)
Chloride: 101 mmol/L (ref 99–110)
Creatinine: 1.3 mg/dL (ref 0.9–1.3)
Est, Glom Filt Rate: >60 (ref >60)
Glucose: 91 mg/dL (ref 70–99)
Potassium: 4 mmol/L (ref 3.5–5.1)
Sodium: 141 mmol/L (ref 136–145)
Total Bilirubin: 0.4 mg/dL (ref 0.0–1.0)
Total Protein: 7.4 g/dL (ref 6.4–8.2)

## 2022-01-20 LAB — SYPHILIS ANTIBODY CASCADING REFLEX: Total Syphillis IgG/IgM: NONREACTIVE

## 2022-01-20 LAB — LIPASE: Lipase: 17 U/L (ref 13.0–60.0)

## 2022-01-20 NOTE — Telephone Encounter (Signed)
Once all results are in, I will fill out a result note

## 2022-01-20 NOTE — Telephone Encounter (Signed)
Pt informed

## 2022-01-20 NOTE — Telephone Encounter (Signed)
Please inform patient that 7 out of 9 labs has resulted, so we are still waiting for 2 more to come in.    Inform patient that he does not need to call in for the results, but that we will contact him with the results.    Inform patient that the 7 labs that have resulted so far are 100% normal and not indicative of any abnormality.

## 2022-01-20 NOTE — Telephone Encounter (Signed)
Patient contacted the office in regards to his lab results, please advise [phone]

## 2022-01-20 NOTE — Telephone Encounter (Signed)
From: Adrienne Mocha  To: Dr. Marlowe Aschoff Stump  Sent: 01/20/2022 2:24 AM EDT  Subject: Question regarding CBC    Hey Dr. Treasa School, if possible could you please explain these results to me?

## 2022-01-21 LAB — C.TRACHOMATIS N.GONORRHOEAE DNA, URINE
C. trachomatis DNA ,Urine: NEGATIVE
N. gonorrhoeae DNA, Urine: NEGATIVE

## 2022-01-22 LAB — H. PYLORI BREATH TEST, ADULT: H Pylori Breath Test: NEGATIVE

## 2022-04-05 ENCOUNTER — Encounter
Payer: BLUE CROSS/BLUE SHIELD | Attending: Internal Medicine | Primary: Student in an Organized Health Care Education/Training Program

## 2022-09-01 DIAGNOSIS — M79642 Pain in left hand: Secondary | ICD-10-CM | POA: Insufficient documentation

## 2022-09-14 ENCOUNTER — Ambulatory Visit
Admission: EM | Admit: 2022-09-14 | Discharge: 2022-09-14 | Disposition: A | Discharge status: Home / Self Care | Payer: BC Managed Care – PPO | Attending: Family Medicine | Admitting: Family Medicine

## 2022-09-14 DIAGNOSIS — B350 Tinea barbae and tinea capitis: Secondary | ICD-10-CM | POA: Diagnosis not present

## 2022-09-14 MED ORDER — GRISEOFULVIN ULTRAMICROSIZE 125 MG PO TABS: 375.0000 mg | ORAL_TABLET | Freq: Every day | ORAL | 0 refills | Status: AC

## 2022-09-14 NOTE — ED Provider Notes (Signed)
[facility]    CSN: [id number] Arrival date & time: 09/14/22  1928      History   Chief Complaint Chief Complaint  Patient presents with   Rash    HPI Tyler Holden is a 26 y.o. male.   HPI  Tyler Holden presents for scalp rash that he noticed today.  He has history of athlete's foot.  He noticed that his hair is gone in that section.  He has been using a prescription shampoo and oral antibiotics for a scalp infection that was prescribed by his PCP.   History reviewed. No pertinent past medical history.  Patient Active Problem List   Diagnosis Date Noted   Pain of left hand 09/01/2022   Anxiety 09/14/2018   Palpitations 09/14/2018   Preventative health care 11/09/2013    History reviewed. No pertinent surgical history.     Home Medications    Prior to Admission medications   Medication Sig Start Date End Date Taking? Authorizing Provider  griseofulvin (GRIS-PEG) 125 MG tablet Take 3 tablets (375 mg total) by mouth daily. 09/14/22 10/26/22 Yes Katha Cabal, DO    Family History History reviewed. No pertinent family history.  Social History Social History   Tobacco Use   Smoking status: Never   Smokeless tobacco: Never  Substance Use Topics   Alcohol use: Yes   Drug use: No     Allergies   Latex   Review of Systems Review of Systems :negative unless otherwise stated in HPI.      Physical Exam Triage Vital Signs ED Triage Vitals  Enc Vitals Group     BP 09/14/22 2023 132/66     Pulse Rate 09/14/22 2023 66     Resp 09/14/22 2023 16     Temp 09/14/22 2023 98.6 F (37 C)     Temp Source 09/14/22 2023 Oral     SpO2 09/14/22 2023 99 %     Weight --      Height --      Head Circumference --      Peak Flow --      Pain Score 09/14/22 2022 0     Pain Loc --      Pain Edu? --      Excl. in GC? --    No data found.  Updated Vital Signs BP 132/66 (BP Location: Left Arm)   Pulse 66   Temp 98.6 F (37 C) (Oral)   Resp 16    SpO2 99%   Visual Acuity Right Eye Distance:   Left Eye Distance:   Bilateral Distance:    Right Eye Near:   Left Eye Near:    Bilateral Near:     Physical Exam  GEN: alert, well appearing male, in no acute distress  EYES: extra occular movements intact, no scleral injection CV: regular rate  RESP: no increased work of breathing SKIN: warm and dry; 3 cm circular lesion on the posterior left occiput     UC Treatments / Results  Labs (all labs ordered are listed, but only abnormal results are displayed) Labs Reviewed - No data to display  EKG   Radiology No results found.  Procedures Procedures (including critical care time)  Medications Ordered in UC Medications - No data to display  Initial Impression / Assessment and Plan / UC Course  I have reviewed the triage vital signs and the nursing notes.  Pertinent labs & imaging results that were available during my care of the patient were  reviewed by me and considered in my medical decision making (see chart for details).     Patient is a 26 y.o. Tyler Holden presents for scalp rash.  Overall, patient is well-appearing and well-hydrated.  Vital signs stable.  Tyler Holden is afebrile.  Exam concerning for tinea capitis.  Treat with griseofulvin for 6 weeks.  Reviewed expectations regarding course of current medical issues.  All questions asked were answered.  Outlined signs and symptoms indicating need for more acute intervention. Patient verbalized understanding. After Visit Summary given.   Final Clinical Impressions(s) / UC Diagnoses   Final diagnoses:  Tinea capitis     Discharge Instructions      You have ringworm.  Stop to the pharmacy to pick up your prescriptions.  You do not have to continue the doxycycline ketoconazole shampoo prescribed by your PCP.     ED Prescriptions     Medication Sig Dispense Auth. Provider   griseofulvin (GRIS-PEG) 125 MG tablet Take 3 tablets (375 mg total) by mouth daily. 126  tablet Aravind Chrismer, Seward Meth, DO      PDMP not reviewed this encounter.              Katha Cabal, DO 09/14/22 2107

## 2022-09-14 NOTE — Discharge Instructions (Signed)
You have ringworm.  Stop to the pharmacy to pick up your prescriptions.  You do not have to continue the doxycycline ketoconazole shampoo prescribed by your PCP.

## 2022-09-14 NOTE — ED Triage Notes (Signed)
Patient presents to UC for rash located on back of head and scalp irritation since today. States he has a hx of fungal infection. He states he uses a shampoo and oral antibiotics that were prescribed earlier this month by his PCP.

## 2024-05-09 ENCOUNTER — Other Ambulatory Visit: Payer: Self-pay

## 2024-05-09 ENCOUNTER — Emergency Department (HOSPITAL_COMMUNITY)

## 2024-05-09 ENCOUNTER — Encounter (HOSPITAL_COMMUNITY): Payer: Self-pay | Admitting: Emergency Medicine

## 2024-05-09 ENCOUNTER — Emergency Department (HOSPITAL_COMMUNITY)
Admission: EM | Admit: 2024-05-09 | Discharge: 2024-05-09 | Disposition: A | Discharge status: Home / Self Care | Attending: Emergency Medicine | Admitting: Emergency Medicine

## 2024-05-09 DIAGNOSIS — Y9389 Activity, other specified: Secondary | ICD-10-CM | POA: Diagnosis not present

## 2024-05-09 DIAGNOSIS — Z91040 Latex allergy status: Secondary | ICD-10-CM | POA: Diagnosis not present

## 2024-05-09 DIAGNOSIS — W228XXA Striking against or struck by other objects, initial encounter: Secondary | ICD-10-CM | POA: Insufficient documentation

## 2024-05-09 DIAGNOSIS — S0081XA Abrasion of other part of head, initial encounter: Secondary | ICD-10-CM | POA: Insufficient documentation

## 2024-05-09 DIAGNOSIS — S0990XA Unspecified injury of head, initial encounter: Secondary | ICD-10-CM | POA: Diagnosis present

## 2024-05-09 DIAGNOSIS — Y9239 Other specified sports and athletic area as the place of occurrence of the external cause: Secondary | ICD-10-CM | POA: Insufficient documentation

## 2024-05-09 MED ORDER — ONDANSETRON HCL 4 MG PO TABS: 4.0000 mg | ORAL_TABLET | Freq: Four times a day (QID) | ORAL | 0 refills | Status: AC

## 2024-05-09 MED ORDER — IBUPROFEN 800 MG PO TABS
800.0000 mg | ORAL_TABLET | Freq: Once | ORAL | Status: DC
  Filled 2024-05-09: qty 1

## 2024-05-09 MED ORDER — ONDANSETRON 4 MG PO TBDP
8.0000 mg | ORAL_TABLET | Freq: Once | ORAL | Status: DC
  Filled 2024-05-09: qty 2

## 2024-05-09 NOTE — ED Notes (Signed)
 While getting pts updated vitals, pt said he's been here for so long, already have scans done and results are back on his phone. Why can't they just see him and tell him if he had a concussion or not. Pt refused temp

## 2024-05-09 NOTE — Discharge Instructions (Addendum)
 Evaluation today was overall reassuring.  There was some concern on your CT for possible mass and/or swelling in your brain.  Strongly recommend that you follow-up with your PCP and order an outpatient brain MRI for further evaluation.  If you start to have any weakness or numbness in your extremities, facial droop, slurred speech, visual changes, worsening headache or any other concerning symptom please return to the ED for further evaluation.  Otherwise recommend conservative treatment for a concussion at home

## 2024-05-09 NOTE — ED Triage Notes (Addendum)
 Pt in with pain to R forehead after injury at the gym earlier yesterday afternoon. Pt states he was doing incline bar bell lift using 160lb bar bell, missed the racks and hit his head with the bar. Denies any LOC or neck pain at the time of event, states he has had a HA and nausea since.

## 2024-05-09 NOTE — ED Notes (Signed)
Pt refused ordered medications.

## 2024-05-09 NOTE — ED Notes (Signed)
 Pt requesting to speak with provider again. PA-C notified via secure chat.

## 2024-05-09 NOTE — ED Notes (Signed)
 Pt states he has been waiting a lot and would like to have his results read to him so he can be d/c  RN assured pt we are working to get him back as fast as possible.

## 2024-05-09 NOTE — ED Provider Notes (Addendum)
 [place] EMERGENCY DEPARTMENT AT [facility] Provider Note   CSN: [id number] Arrival date & time: 05/09/24  9676     Patient presents with: Head Injury  HPI Tyler Holden is a 28 y.o. male presenting for head injury.  Occurred yesterday afternoon while lifting weights at the gym.  Hit the right forehead with a barbell at the gym.  Approximately 160 pounds.  Reports a bruise in the right forehead and also headache with light sensitivity and nausea but no vomiting.  He denies visual changes, weakness or numbness in his extremities, and slurred speech.    Head Injury      Prior to Admission medications   Medication Sig Start Date End Date Taking? Authorizing Provider  ondansetron  (ZOFRAN ) 4 MG tablet Take 1 tablet (4 mg total) by mouth every 6 (six) hours. 05/09/24  Yes Lang Norleen POUR, PA-C    Allergies: Latex    Review of Systems See HPI  Updated Vital Signs BP 134/74 (BP Location: Left Arm)   Pulse (!) 55   Temp 98.2 F (36.8 C)   Resp 17   Wt 72.6 kg   SpO2 100%   BMI 21.71 kg/m   Physical Exam Vitals and nursing note reviewed.  HENT:     Head: Normocephalic and atraumatic. No raccoon eyes or Battle's sign.      Mouth/Throat:     Mouth: Mucous membranes are moist.  Eyes:     General:        Right eye: No discharge.        Left eye: No discharge.     Conjunctiva/sclera: Conjunctivae normal.  Cardiovascular:     Rate and Rhythm: Normal rate and regular rhythm.     Pulses: Normal pulses.     Heart sounds: Normal heart sounds.  Pulmonary:     Effort: Pulmonary effort is normal.     Breath sounds: Normal breath sounds.  Abdominal:     General: Abdomen is flat.     Palpations: Abdomen is soft.  Skin:    General: Skin is warm and dry.  Neurological:     General: No focal deficit present.     Comments: GCS 15. Speech is goal oriented. No deficits appreciated to CN III-XII; symmetric eyebrow raise, no facial drooping, tongue midline. Patient  has equal grip strength bilaterally with 5/5 strength against resistance in all major muscle groups bilaterally. Sensation to light touch intact. Patient moves extremities without ataxia. Normal finger-nose-finger. Patient ambulatory with steady gait.  Psychiatric:        Mood and Affect: Mood normal.      (all labs ordered are listed, but only abnormal results are displayed) Labs Reviewed - No data to display  EKG: None  Radiology: CT Head Wo Contrast Result Date: 05/09/2024 CLINICAL DATA:  28 year old male struck head on metal weight bar at gym. Subsequent headache and nausea. EXAM: CT HEAD WITHOUT CONTRAST TECHNIQUE: Contiguous axial images were obtained from the base of the skull through the vertex without intravenous contrast. RADIATION DOSE REDUCTION: This exam was performed according to the departmental dose-optimization program which includes automated exposure control, adjustment of the mA and/or kV according to patient size and/or use of iterative reconstruction technique. COMPARISON:  None Available. FINDINGS: Brain: Bulky coarse calcification within the right choroid plexus at the atrium of the right lateral ventricle is asymmetric, up to 1.5 cm. No associated ventriculomegaly, no transependymal edema. Normal background cerebral volume. No midline shift, mass effect, evidence of mass lesion,  intracranial hemorrhage or evidence of cortically based acute infarction. Gray-white matter differentiation is within normal limits throughout the brain. Vascular: No suspicious intracranial vascular hyperdensity. Skull: Intact, negative. Sinuses/Orbits: Visualized paranasal sinuses and mastoids are clear. Other: No discrete orbit or scalp soft tissue injury. IMPRESSION: 1. No acute traumatic injury identified. 2. Bulky and asymmetric coarse calcification within the right choroid plexus calcification near the atrium of the right lateral ventricle. This is nonspecific, and there is no associated  ventriculomegaly or transependymal edema. But calcified subependymoma or ependymoma not excluded. Consider nonemergent Brain MRI without and with contrast to further characterize. Electronically Signed   By: VEAR Hurst M.D.   On: 05/09/2024 05:47     Procedures   Medications Ordered in the ED  ibuprofen  (ADVIL ) tablet 800 mg (has no administration in time range)  ondansetron  (ZOFRAN -ODT) disintegrating tablet 8 mg (has no administration in time range)                                    Medical Decision Making Amount and/or Complexity of Data Reviewed Radiology: ordered.  Risk Prescription drug management.   28 year old well-appearing male presenting for head injury.  Exam was unremarkable.  CT showed no acute intracranial abnormality.  There were some findings to suggest possible brain tumor.  See details above.  I discussed these findings with the patient.  Advised him to follow-up with PCP and recommended that he undergo an outpatient MRI as recommended by radiologist here.  Reports that he does have a PCP.  Also provided him with neurosurgery follow-up as well.  Discussed return precautions.  Gave him ibuprofen  and Zofran  for his nausea and headache.  Sent Zofran  to his pharmacy.  Also could be a mild concussion given his symptoms.  Advised conservative treatment.  Discharged good condition.     Final diagnoses:  Injury of head, initial encounter    ED Discharge Orders          Ordered    ondansetron  (ZOFRAN ) 4 MG tablet  Every 6 hours        05/09/24 1234               Lang Norleen POUR, PA-C 05/09/24 1244    Dreama Longs, MD 05/11/24 2205

## 2024-05-14 ENCOUNTER — Other Ambulatory Visit: Payer: Self-pay | Admitting: Family Medicine

## 2024-05-14 DIAGNOSIS — E559 Vitamin D deficiency, unspecified: Secondary | ICD-10-CM

## 2024-05-14 DIAGNOSIS — R5383 Other fatigue: Secondary | ICD-10-CM

## 2024-05-22 ENCOUNTER — Ambulatory Visit
Admission: RE | Admit: 2024-05-22 | Discharge: 2024-05-22 | Disposition: A | Discharge status: Home / Self Care | Source: Ambulatory Visit | Attending: Family Medicine | Admitting: Family Medicine

## 2024-05-22 DIAGNOSIS — E559 Vitamin D deficiency, unspecified: Secondary | ICD-10-CM

## 2024-05-22 DIAGNOSIS — R5383 Other fatigue: Secondary | ICD-10-CM
